# Patient Record
Sex: Male | Born: 1970 | Race: White | Hispanic: No | Marital: Single | Smoking: Current every day smoker
Health system: Southern US, Community
[De-identification: ages and names within clinical notes are randomized; demographics above are authoritative.]

## PROBLEM LIST (undated history)

## (undated) HISTORY — PX: TONSILLECTOMY: SUR1361

---

## 2016-04-03 ENCOUNTER — Emergency Department (HOSPITAL_BASED_OUTPATIENT_CLINIC_OR_DEPARTMENT_OTHER): Payer: Medicaid - Out of State

## 2016-04-03 ENCOUNTER — Encounter (HOSPITAL_BASED_OUTPATIENT_CLINIC_OR_DEPARTMENT_OTHER): Payer: Self-pay | Admitting: *Deleted

## 2016-04-03 ENCOUNTER — Emergency Department (HOSPITAL_BASED_OUTPATIENT_CLINIC_OR_DEPARTMENT_OTHER)
Admission: EM | Admit: 2016-04-03 | Discharge: 2016-04-03 | Disposition: A | Discharge status: Home / Self Care | Payer: Medicaid - Out of State | Attending: Emergency Medicine | Admitting: Emergency Medicine

## 2016-04-03 DIAGNOSIS — Y999 Unspecified external cause status: Secondary | ICD-10-CM | POA: Insufficient documentation

## 2016-04-03 DIAGNOSIS — Y9301 Activity, walking, marching and hiking: Secondary | ICD-10-CM | POA: Diagnosis not present

## 2016-04-03 DIAGNOSIS — M25531 Pain in right wrist: Secondary | ICD-10-CM | POA: Diagnosis not present

## 2016-04-03 DIAGNOSIS — W101XXA Fall (on)(from) sidewalk curb, initial encounter: Secondary | ICD-10-CM | POA: Insufficient documentation

## 2016-04-03 DIAGNOSIS — F17210 Nicotine dependence, cigarettes, uncomplicated: Secondary | ICD-10-CM | POA: Diagnosis not present

## 2016-04-03 DIAGNOSIS — M25521 Pain in right elbow: Secondary | ICD-10-CM | POA: Diagnosis not present

## 2016-04-03 DIAGNOSIS — Y929 Unspecified place or not applicable: Secondary | ICD-10-CM | POA: Insufficient documentation

## 2016-04-03 MED ORDER — TRAMADOL HCL 50 MG PO TABS
50.0000 mg | ORAL_TABLET | Freq: Once | ORAL | Status: AC
  Administered 2016-04-03: 50 mg via ORAL
  Filled 2016-04-03: qty 1

## 2016-04-03 MED ORDER — IBUPROFEN 800 MG PO TABS
800.0000 mg | ORAL_TABLET | Freq: Once | ORAL | Status: AC
  Administered 2016-04-03: 800 mg via ORAL
  Filled 2016-04-03: qty 1

## 2016-04-03 NOTE — Discharge Instructions (Signed)
Due to concern for a fracture in your hand, you will need to wear a splint and follow up with a hand doctor in 10-14 days. It is very important for you to follow up with Dr. Amanda Pea for repeat X-rays of your hand. Return to ED with new, worsening or concerning symptoms.   Scaphoid Fracture, Wrist A fracture is a break in the bone. The bone you have broken often does not show up as a fracture on x-ray until later on in the healing phase. This bone is called the scaphoid bone. With this bone, your caregiver will often cast or splint your wrist as though it is fractured, even if a fracture is not seen on the x-ray. This is often done with wrist injuries in which there is tenderness at the base of the thumb. An x-ray at 1-3 weeks after your injury may confirm this fracture. A cast or splint is used to protect and keep your injured bone in good position for healing. The cast or splint will be on generally for about 6 to 16 weeks, depending on your health, age, the fracture location and how quickly you heal. Another name for the scaphoid bone is the navicular bone. HOME CARE INSTRUCTIONS   To lessen the swelling and pain, keep the injured part elevated above your heart while sitting or lying down.  Apply ice to the injury for 15-20 minutes, 03-04 times per day while awake, for 2 days. Put the ice in a plastic bag and place a thin towel between the bag of ice and your cast.  If you have a plaster or fiberglass cast or splint:  Do not try to scratch the skin under the cast using sharp or pointed objects.  Check the skin around the cast every day. You may put lotion on any red or sore areas.  Keep your cast or splint dry and clean.  If you have a plaster splint:  Wear the splint as directed.  You may loosen the elastic bandage around the splint if your fingers become numb, tingle, or turn cold or blue.  If you have been put in a removable splint, wear and use as directed.  Do not put pressure on any  part of your cast or splint; it may deform or break. Rest your cast or splint only on a pillow the first 24 hours until it is fully hardened.  Your cast or splint can be protected during bathing with a plastic bag. Do not lower the cast or splint into water.  Only take over-the-counter or prescription medicines for pain, discomfort, or fever as directed by your caregiver.  If your caregiver has given you a follow up appointment, it is very important to keep that appointment. Not keeping the appointment could result in chronic pain and decreased function. If there is any problem keeping the appointment, you must call back to this facility for assistance. SEEK IMMEDIATE MEDICAL CARE IF:   Your cast gets damaged, wet or breaks.  You have continued severe pain or more swelling than you did before the cast or splint was put on.  Your skin or nails below the injury turn blue or gray, or feel cold or numb.  You have tingling or burning pain in your fingers or increasing pain with movement of your fingers   This information is not intended to replace advice given to you by your health care provider. Make sure you discuss any questions you have with your health care provider.  Document Released: 11/06/2002 Document Revised: 02/08/2012 Document Reviewed: 05/29/2015 Elsevier Interactive Patient Education [employer]2016 [employer]

## 2016-04-03 NOTE — ED Notes (Signed)
Fell. Injury to his right wrist and elbow. Swelling and pain.

## 2016-04-03 NOTE — ED Provider Notes (Signed)
CSN: [id number]     Arrival date & time 04/03/16  1521 History   First MD Initiated Contact with Patient 04/03/16 1554     Chief Complaint  Patient presents with  . Fall  . Arm Injury   Patient is a 45 y.o. male presenting with arm injury.  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 Pt was walking on a road when he attempted to step onto the sidewalk and tripped over the curb. He fell forwards onto his outstretched hands. He denies head injury or LOC. He is complaining of right wrist and elbow pain. The pain in his wrist is described as aching and is increased with ROM at the wrist and thumb. He reports associated swelling of the wrist. He states his elbow is also aching. This pain is also exacerbated by ROM at the elbow. He states that touching the tip of his elbow causes shooting pains that go towards his wrist. Denies numbness, tingling, weakness or loss of sensation in his right arm. He has not tried pain medications. No other complaints today.   History reviewed. No pertinent past medical history. Past Surgical History  Procedure Laterality Date  . Tonsillectomy     No family history on file. Social History  Substance Use Topics  . Smoking status: Current Every Day Smoker -- 1.00 packs/day    Types: Cigarettes  .  Smokeless tobacco: None  . Alcohol Use: No    Review of Systems  All other systems reviewed and are negative.     Allergies  Review of patient's allergies indicates no known allergies.  Home Medications   Prior to Admission medications   Not on File   BP 146/98 mmHg  Pulse 94  Temp(Src) 98.7 F (37.1 C) (Oral)  Resp 18  Ht  (1.88 m)  Wt 106.595 kg  BMI 30.16 kg/m2  SpO2 99% Physical Exam  Constitutional: He appears well-developed and well-nourished. No distress.  HENT:  Head: Normocephalic and atraumatic.  Right Ear: External ear normal.  Left Ear: External ear normal.  Eyes: Conjunctivae are normal. Right eye exhibits no discharge. Left eye exhibits no discharge. No scleral icterus.  Neck: Normal range of motion.  Cardiovascular: Normal rate and intact distal pulses.   Radial pulse palpable. Cap refill < 2 seconds  Pulmonary/Chest: Effort normal.  Musculoskeletal: Normal range of motion.       Right elbow: He exhibits normal range of motion, no swelling, no effusion, no deformity and no laceration. Tenderness found. Olecranon process tenderness noted.       Right wrist: He exhibits tenderness and swelling. He exhibits normal range of motion, no deformity and no laceration.       Arms:  Hands: RIGHT ELBOW TTP over the olecranon process. FROM intact though pt states this is painful. No obvious swelling or deformity. Supination and pronation intact. No wounds.   RIGHT WRIST TTP over the dorsal surface of wrist including anatomical snuff box. Mild associated swelling at the wrist. No obvious deformity. FROM of wrist and digits though pt states wrist and thumb movement is painful. No wounds. Compartments of RUE are soft.  Moves remaining extremities spontaneously.    Neurological: He is alert. Coordination normal.  5/5 grip strength b/l. Deferred testing of wrist strength secondary to pain. Sensation to light touch intact over BUE.   Skin: Skin is warm and  dry.  Psychiatric: He has a normal mood and affect. His behavior is normal.  Nursing note and vitals reviewed.   ED Course  Procedures (including critical care time) Labs Review Labs Reviewed - No data to display  Imaging Review Dg Elbow Complete Right  04/03/2016  CLINICAL DATA:  Post fall, now with right posterior elbow pain. EXAM: RIGHT ELBOW - COMPLETE 3+ VIEW COMPARISON:  None. FINDINGS: The lateral radiograph is degraded due to obliquity. No fracture or elbow joint effusion. There is minimal soft tissue swelling about the olecranon process and posterior aspect of the distal humerus. No radiopaque foreign body. Joint spaces are preserved. Tiny ossicle adjacent to the medial epicondyle, likely the sequela of remote avulsive injury. IMPRESSION: 1. Mild soft tissue swelling about the posterior aspect of the elbow without associated fracture or elbow joint effusion. 2. Ossicle adjacent to the medial epicondyle, likely the sequela of remote avulsive injury. Electronically Signed   By: Simonne ComeJohn  Watts M.D.   On: 04/03/2016 16:02   Dg Wrist Complete Right  04/03/2016  CLINICAL DATA:  Right wrist pain and swelling after falling on concrete today. EXAM: RIGHT WRIST - COMPLETE 3+ VIEW COMPARISON:  None. FINDINGS: No fracture or dislocation joint spaces are preserved. No erosions. No evidence of chondrocalcinosis. No definite displacement of the pronator quadratus fat pad. Regional soft tissues appear normal. No radiopaque foreign body. IMPRESSION: No fracture. If the patient has pain referable to the anatomic snuff box, splinting and a follow-up radiograph in 10 to 14 days is recommended to evaluate for occult scaphoid fracture. Electronically Signed   By: Simonne ComeJohn  Watts M.D.   On: 04/03/2016 16:03   I have personally reviewed and evaluated these images and lab results as part of my medical decision-making.   EKG Interpretation None      MDM   Final diagnoses:  Right wrist pain  Right elbow pain    Patient presenting with right wrist and elbow pain after FOOSH. Right upper extremity is neurovascularly intact with FROM. Patient X-Ray negative for obvious fracture or dislocation. Due to pain at anatomical snuff box and mechanism of fall, will splint wrist and have pt follow up with hand surgery in 10 days for repeat imaging. Pain managed in ED with motrin. Discussed RICE therapy and use of OTC pain relievers. Discussed importance of hand surgery follow up and pt states understanding. Return precautions discussed at bedside and given in discharge paperwork. Pt is stable for discharge.     Devin HeimlichStevi Ming Mcmannis, PA-C 04/03/16 1723  Benjiman CoreNathan Pickering, MD 04/03/16 [phone]

## 2017-05-31 IMAGING — DX DG WRIST COMPLETE 3+V*R*
4 series · 4 of 4 positions shown · non-contrast
Comparison: None.

CLINICAL DATA: Right wrist pain and swelling after falling on
concrete today.

EXAM:
RIGHT WRIST - COMPLETE 3+ VIEW

[wrist pa]
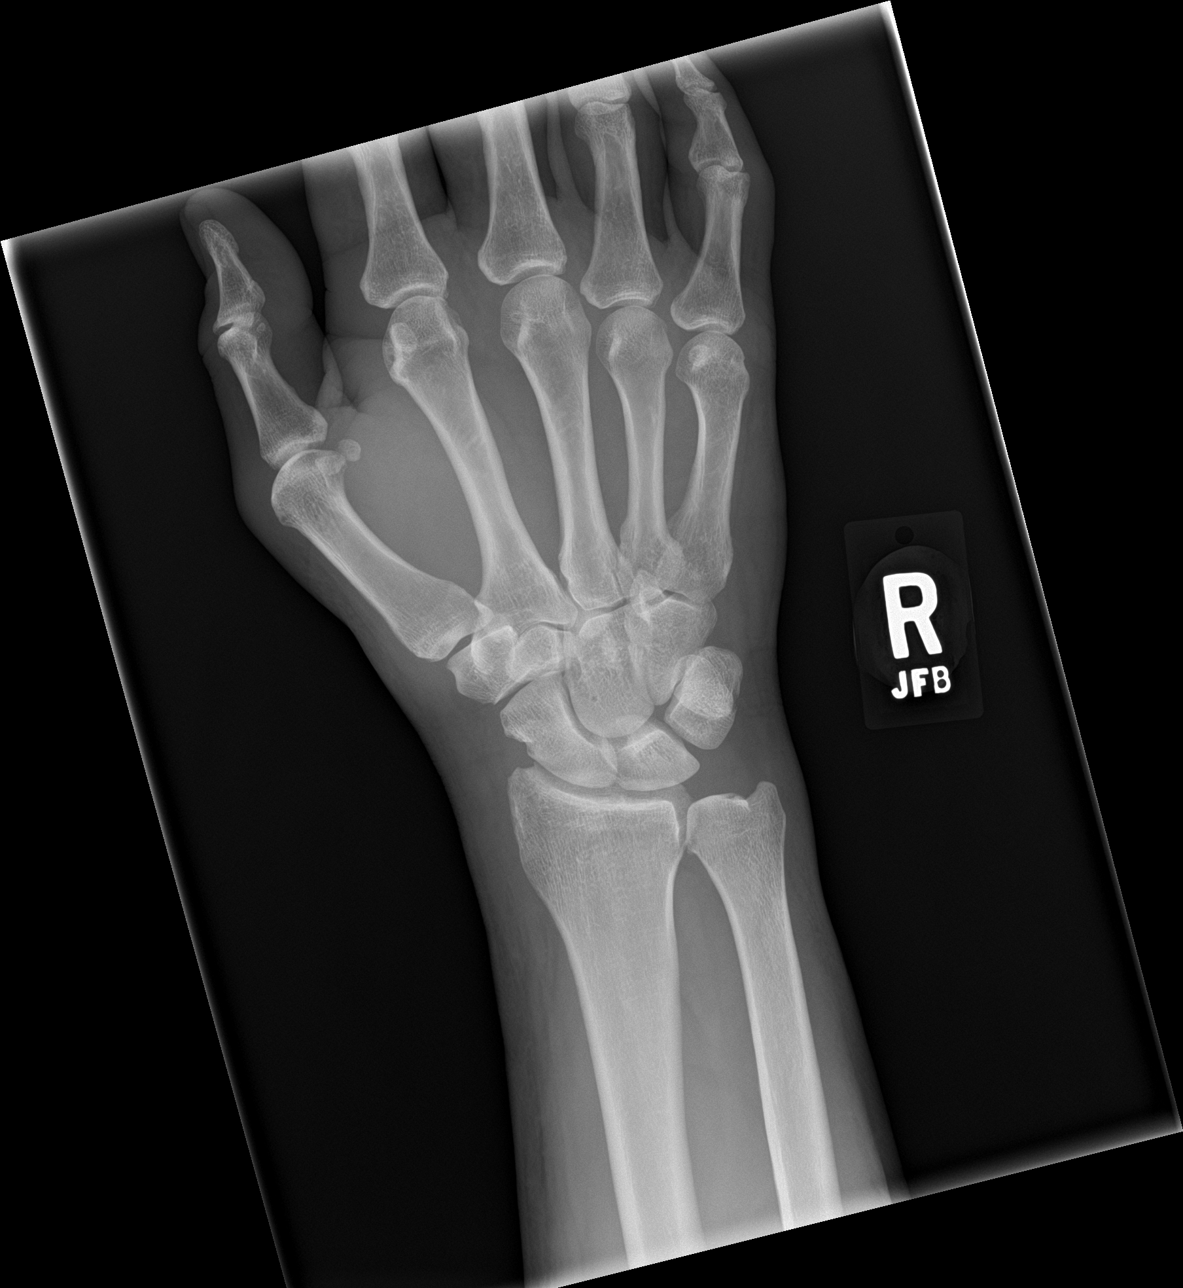

[wrist obl]
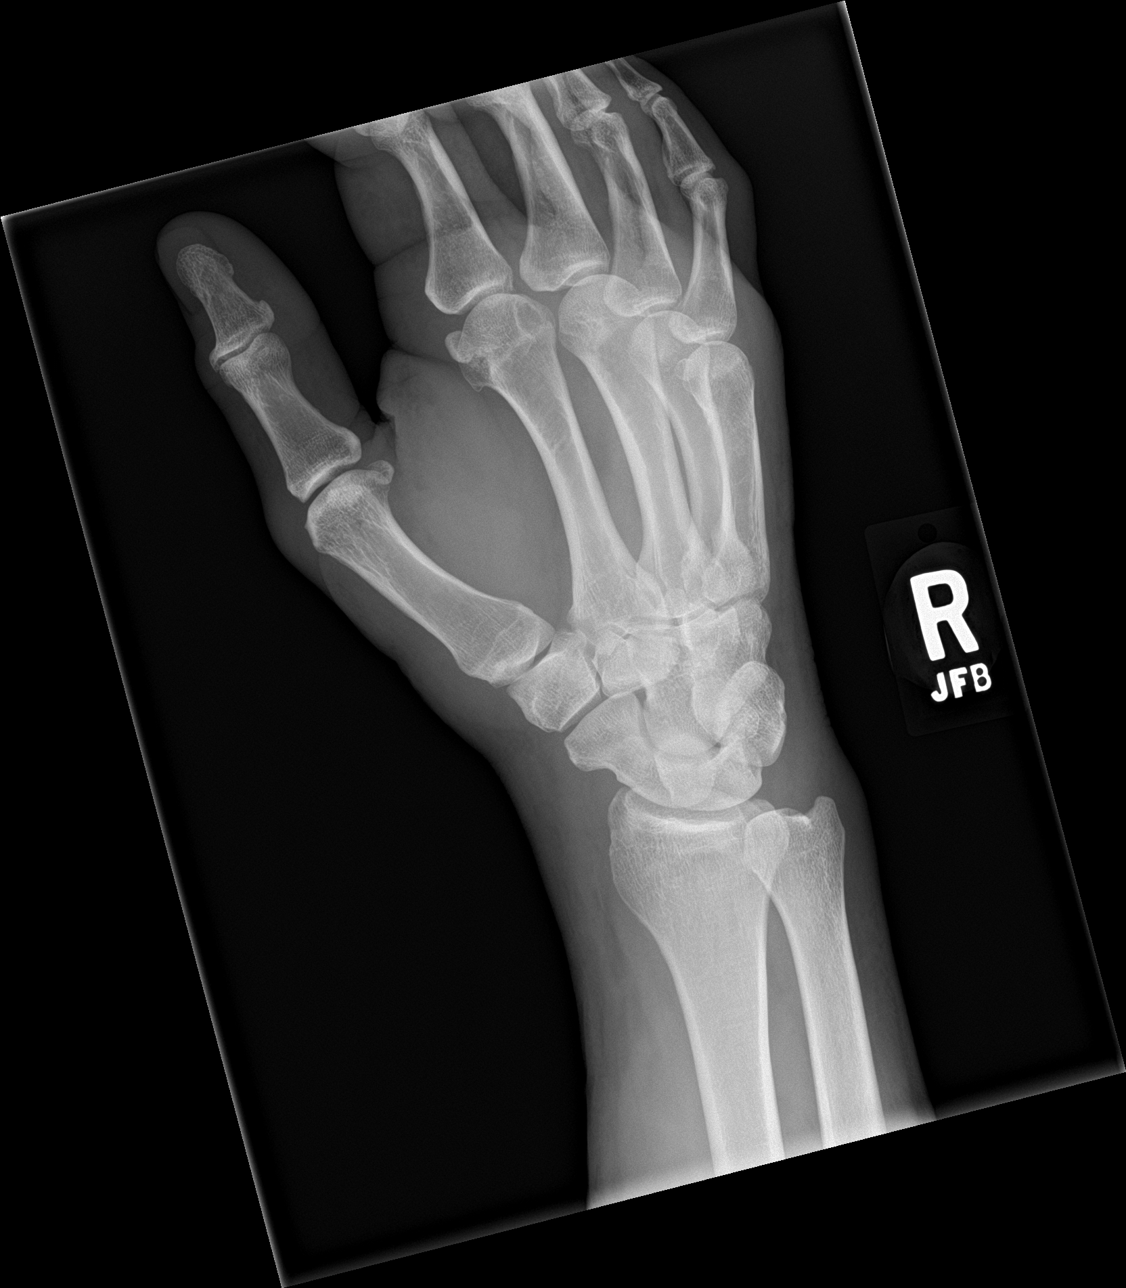

[wrist lat]
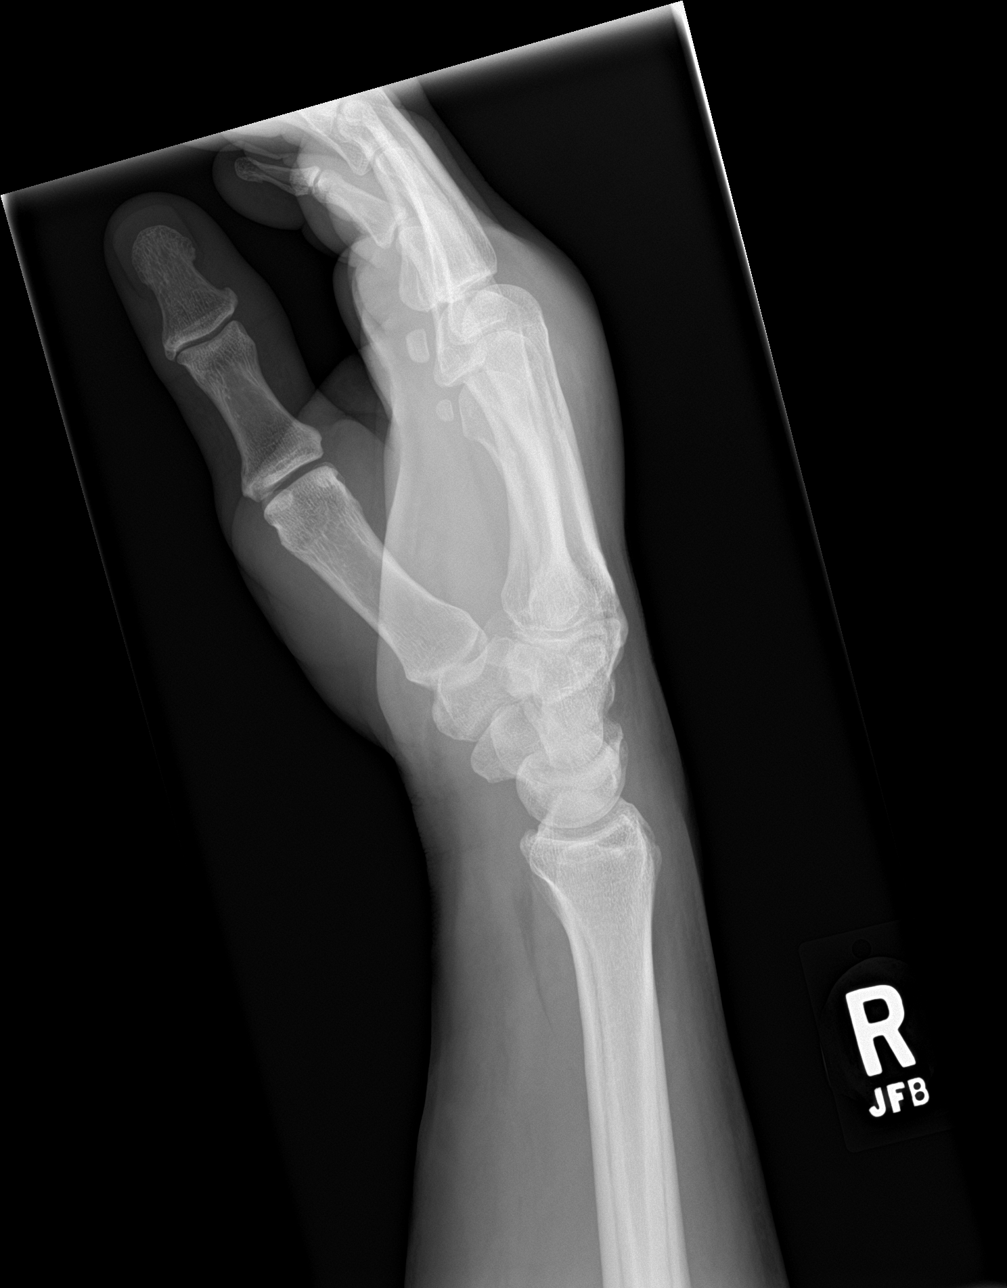

[wrist navicular]
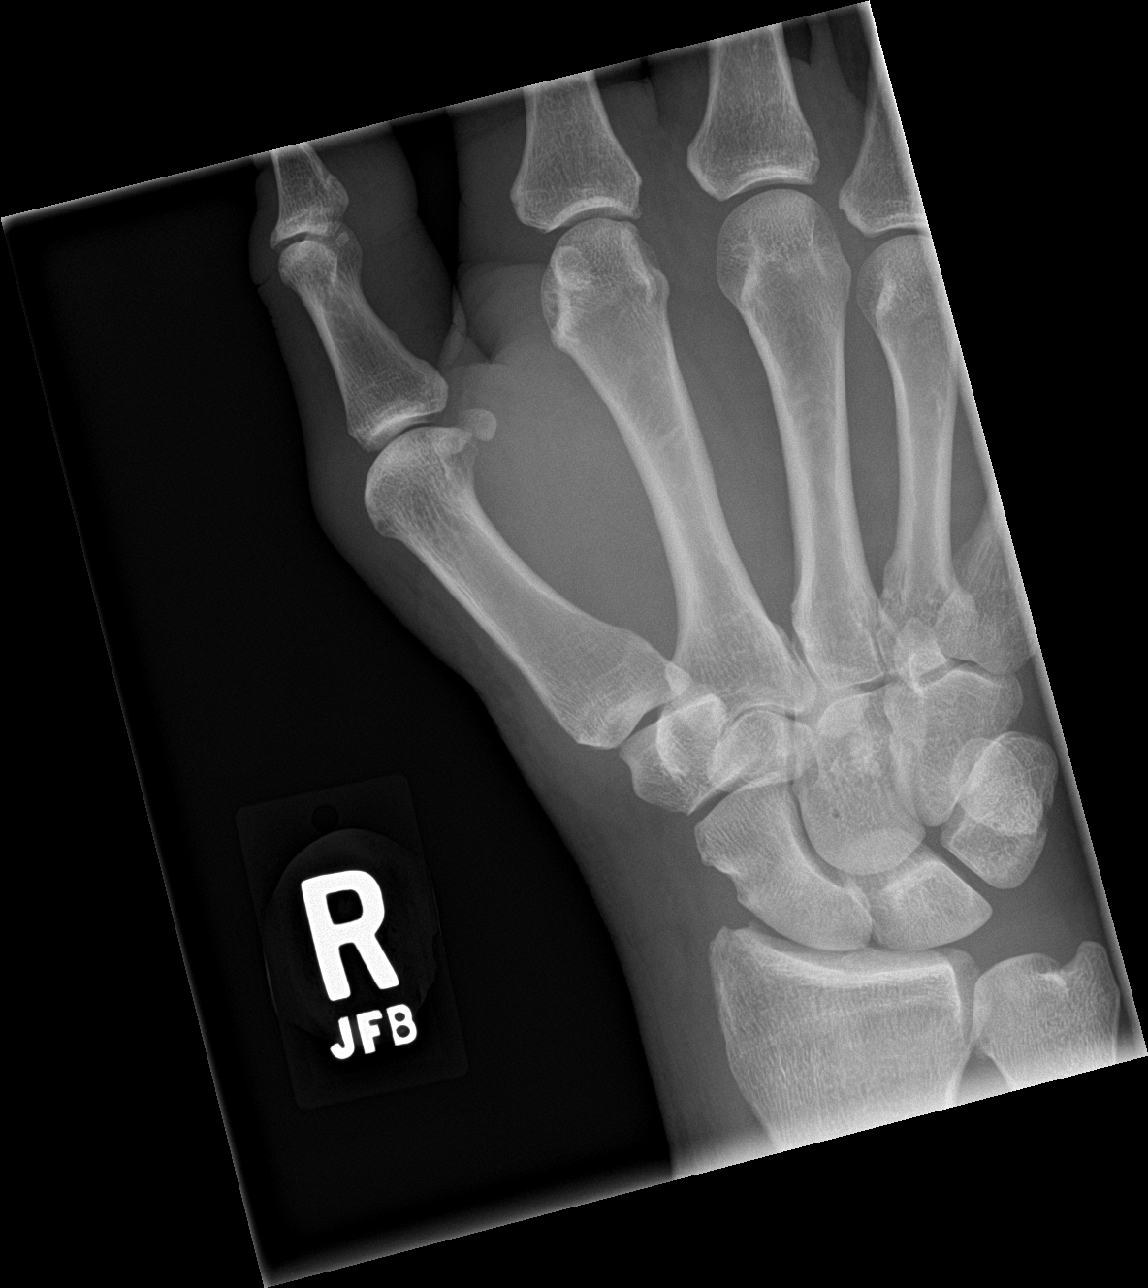

[4 of 4 positions shown; findings below may reference images not displayed]

FINDINGS: No fracture or dislocation joint spaces are preserved. No erosions.
No evidence of chondrocalcinosis. No definite displacement of the
pronator quadratus fat pad. Regional soft tissues appear normal. No
radiopaque foreign body.
IMPRESSION: No fracture. If the patient has pain referable to the anatomic snuff
box, splinting and a follow-up radiograph in 10 to 14 days is
recommended to evaluate for occult scaphoid fracture.

## 2017-12-31 DEATH — deceased
